# Patient Record
Sex: Female | Born: 1960 | Race: Black or African American | Hispanic: No | Marital: Single
Health system: Southern US, Community
[De-identification: ages and names within clinical notes are randomized; demographics above are authoritative.]

---

## 2003-03-04 ENCOUNTER — Other Ambulatory Visit: Payer: Self-pay

## 2003-04-27 ENCOUNTER — Other Ambulatory Visit: Payer: Self-pay

## 2004-07-25 ENCOUNTER — Emergency Department: Payer: Self-pay | Admitting: Emergency Medicine

## 2004-09-02 ENCOUNTER — Emergency Department: Payer: Self-pay | Admitting: Emergency Medicine

## 2004-11-13 ENCOUNTER — Emergency Department: Payer: Self-pay | Admitting: Emergency Medicine

## 2005-01-25 ENCOUNTER — Ambulatory Visit: Payer: Self-pay | Admitting: Family Medicine

## 2006-05-31 ENCOUNTER — Emergency Department: Payer: Self-pay | Admitting: Emergency Medicine

## 2006-06-18 ENCOUNTER — Emergency Department: Payer: Self-pay | Admitting: Emergency Medicine

## 2006-06-19 ENCOUNTER — Other Ambulatory Visit: Payer: Self-pay

## 2006-10-20 ENCOUNTER — Emergency Department: Payer: Self-pay | Admitting: Emergency Medicine

## 2007-01-01 ENCOUNTER — Emergency Department: Payer: Self-pay | Admitting: Emergency Medicine

## 2007-02-24 ENCOUNTER — Emergency Department: Payer: Self-pay | Admitting: Emergency Medicine

## 2007-02-24 ENCOUNTER — Other Ambulatory Visit: Payer: Self-pay

## 2007-05-03 ENCOUNTER — Emergency Department: Payer: Self-pay | Admitting: Emergency Medicine

## 2007-05-24 ENCOUNTER — Emergency Department: Payer: Self-pay | Admitting: Emergency Medicine

## 2007-07-17 ENCOUNTER — Other Ambulatory Visit: Payer: Self-pay

## 2007-07-17 ENCOUNTER — Emergency Department: Payer: Self-pay | Admitting: Emergency Medicine

## 2007-08-06 ENCOUNTER — Other Ambulatory Visit: Payer: Self-pay

## 2007-08-06 ENCOUNTER — Inpatient Hospital Stay: Payer: Self-pay | Admitting: Internal Medicine

## 2007-08-08 ENCOUNTER — Emergency Department: Payer: Self-pay | Admitting: Emergency Medicine

## 2007-08-16 ENCOUNTER — Emergency Department: Payer: Self-pay | Admitting: Emergency Medicine

## 2007-09-08 ENCOUNTER — Emergency Department: Payer: Self-pay | Admitting: Emergency Medicine

## 2007-09-08 ENCOUNTER — Other Ambulatory Visit: Payer: Self-pay

## 2008-08-11 ENCOUNTER — Emergency Department: Payer: Self-pay | Admitting: Emergency Medicine

## 2008-08-14 ENCOUNTER — Emergency Department: Payer: Self-pay | Admitting: Emergency Medicine

## 2008-09-09 ENCOUNTER — Emergency Department: Payer: Self-pay | Admitting: Emergency Medicine

## 2008-11-27 ENCOUNTER — Emergency Department: Payer: Self-pay | Admitting: Emergency Medicine

## 2009-03-05 ENCOUNTER — Emergency Department: Payer: Self-pay | Admitting: Internal Medicine

## 2009-03-30 ENCOUNTER — Emergency Department: Payer: Self-pay | Admitting: Internal Medicine

## 2009-08-01 ENCOUNTER — Emergency Department: Payer: Self-pay | Admitting: Emergency Medicine

## 2009-12-30 IMAGING — CT CT ABD-PELV W/O CM
1 of 2 series · 15 of 32 positions shown, 19 images · IV contrast (agent unspecified)
Comparison: none

REASON FOR EXAM: (1) abdominal pain; (2) abdominal pain
COMMENTS:   LMP: Four weeks ago

PROCEDURE:     CT  - CT ABDOMEN AND PELVIS W[DATE] [DATE]
RESULT:     Comparison: Abdomen pelvis CT with contrast on 06/19/2006. Pelvic
sonogram on 07/17/2007.
TECHNIQUE: CT examination of the abdomen and pelvis was performed without
contrast. Collimation is 8 mm.

[Series 2: abdomen · axial · 0.86mm/px · z∈[+194,+658]mm · 15 of 64 slices shown, 19 images]
[im 3/64  soft-tissue]
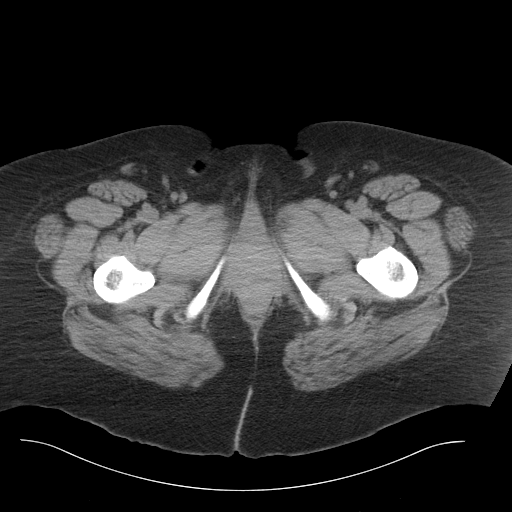
[im 3/64  bone]
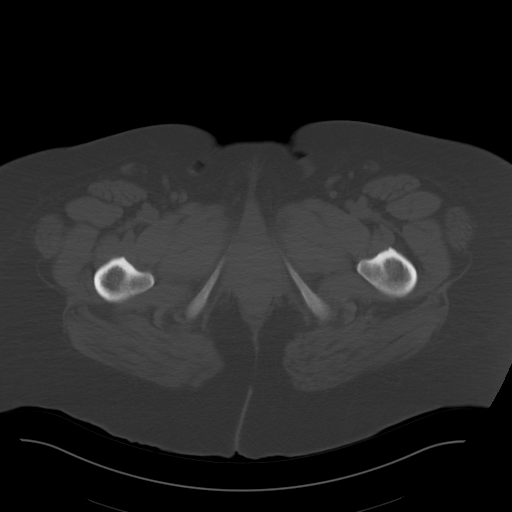
[im 8/64  soft-tissue]
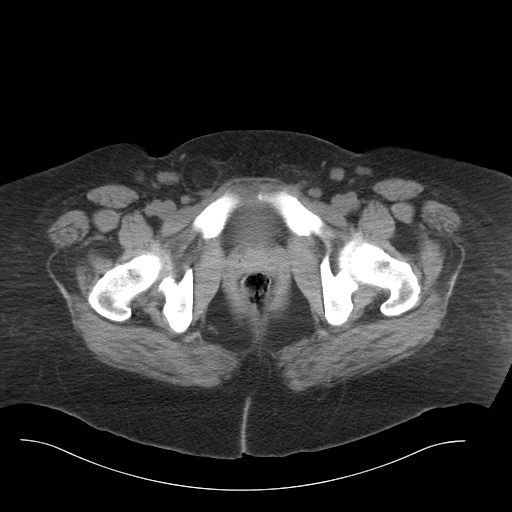
[im 14/64  soft-tissue]
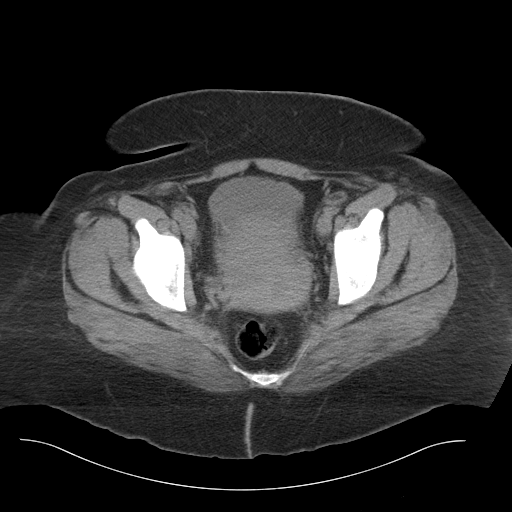
[im 19/64  soft-tissue]
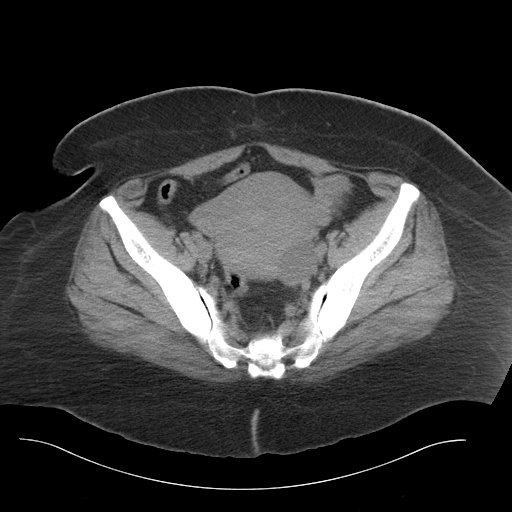
[im 22/64  soft-tissue]
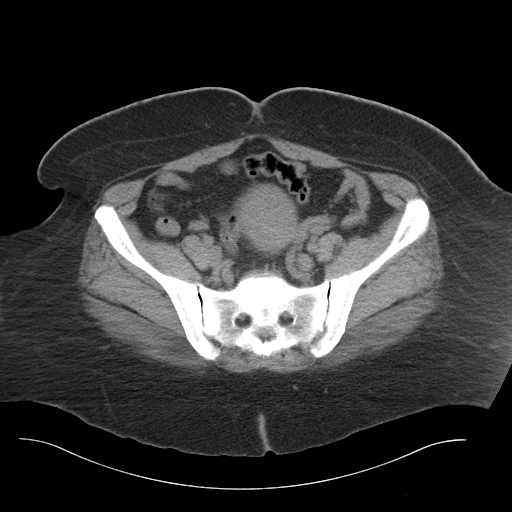
[im 27/64  soft-tissue]
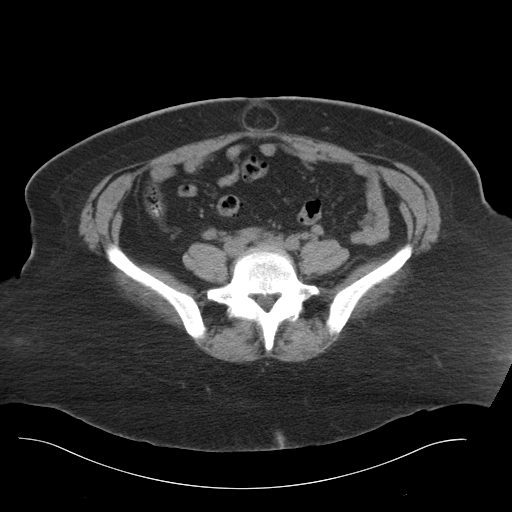
[im 32/64  soft-tissue]
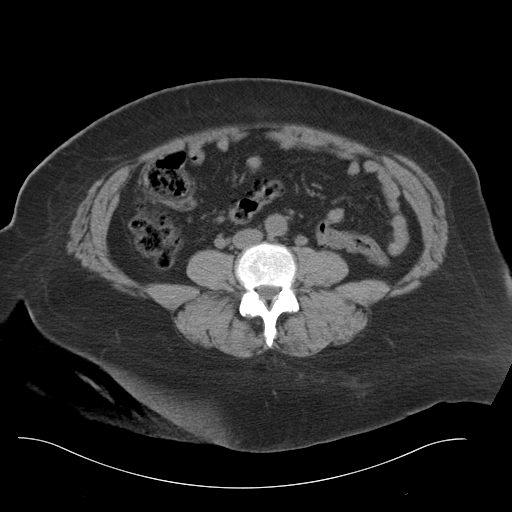
[im 37/64  soft-tissue]
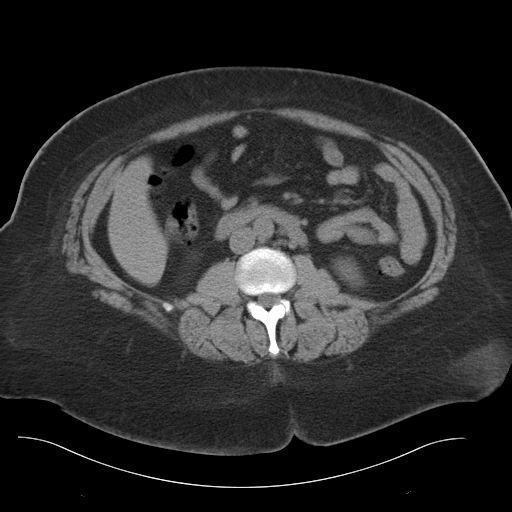
[im 43/64  soft-tissue]
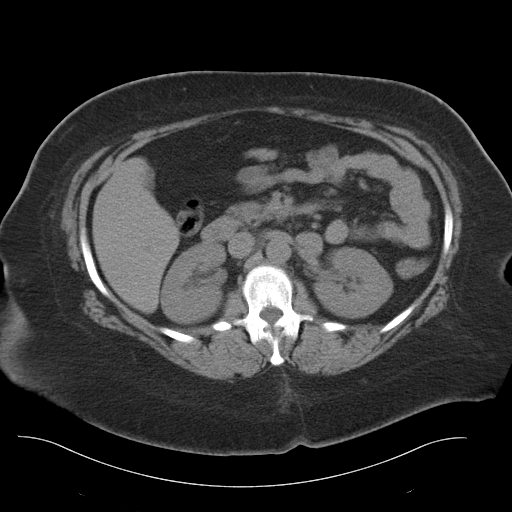
[im 43/64  bone]
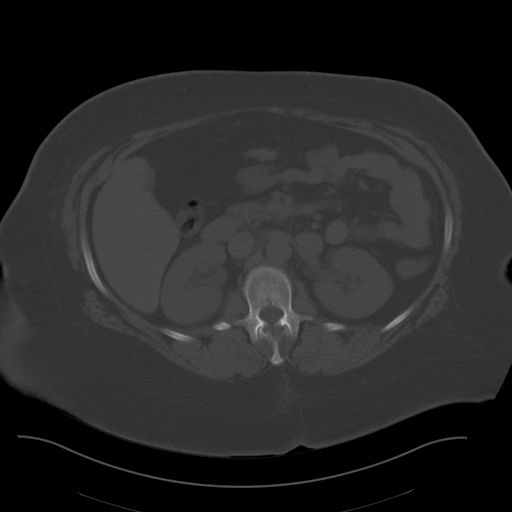
[im 45/64  soft-tissue]
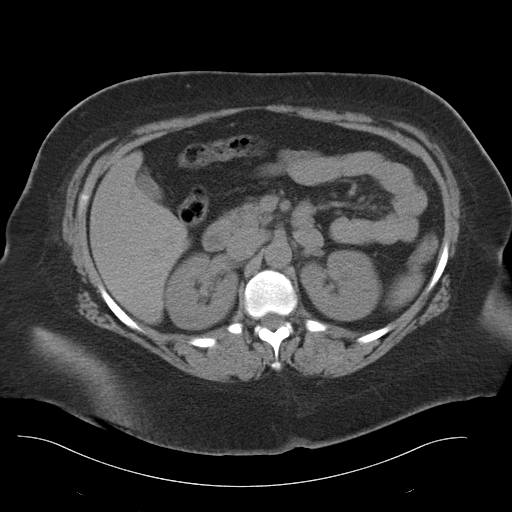
[im 50/64  soft-tissue]
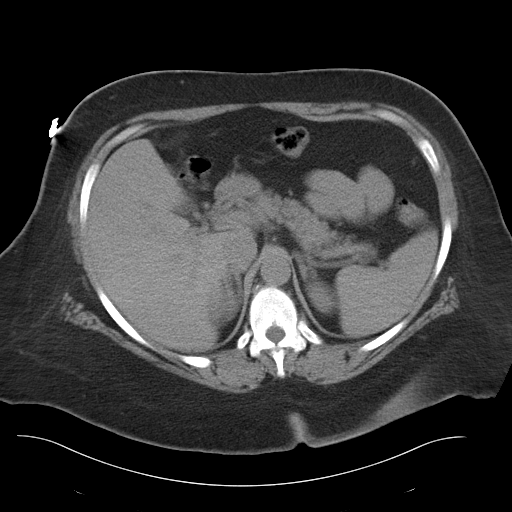
[im 53/64  lung]
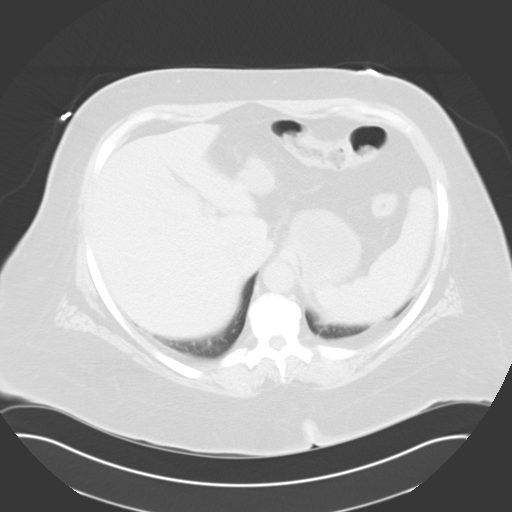
[im 56/64  soft-tissue]
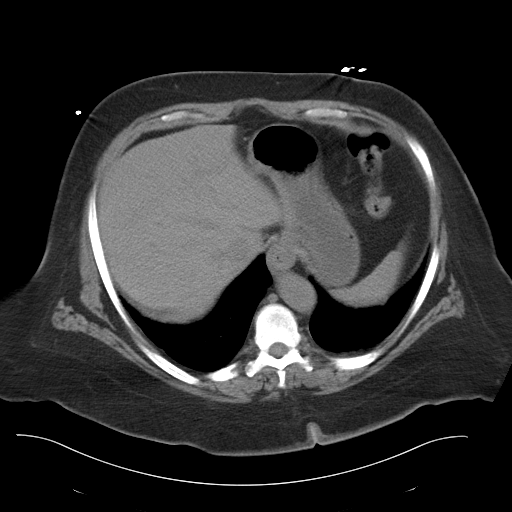
[im 56/64  lung]
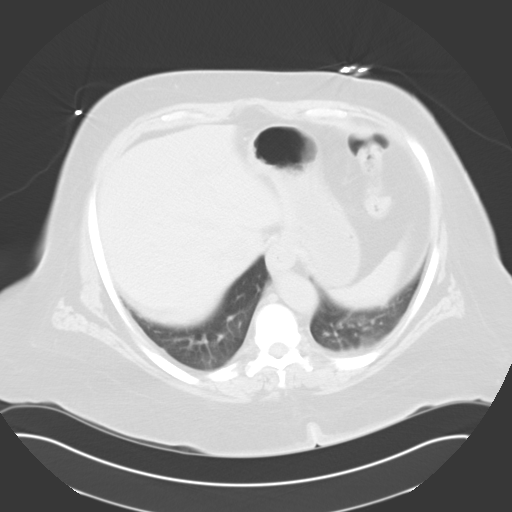
[im 58/64  lung]
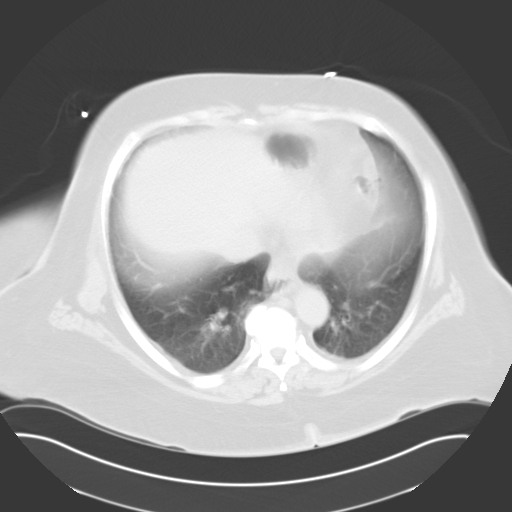
[im 61/64  soft-tissue]
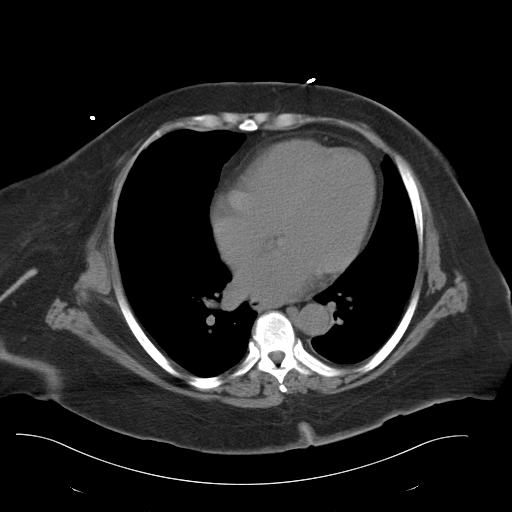
[im 61/64  lung]
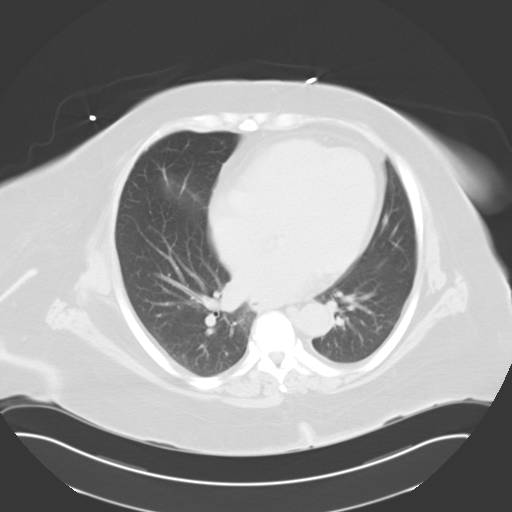

[15 of 32 positions shown; findings below may reference images not displayed]

FINDINGS: Limited evaluation of the lung bases is unremarkable.

Evaluation of the abdominal organs, bowels, and vessels is limited without
contrast. The liver, gallbladder, spleen, pancreas, and adrenal glands are
grossly unremarkable. No renal or ureteral stone is noted. There is no
hydronephrosis.

There is a small fat containing periumbilical hernia. There is no bowel
dilatation. Distal colonic diverticulosis is noted. The appendix is
unremarkable. There is no significant intra abdominal or pelvic fat
stranding. There is no intraperitoneal free air. There is no significant
free fluid. There are no enlarged abdominal pelvic lymph nodes. The uterus
is prominent in size. A 4.4 x 3.0 cm oval structure with high attenuation
material is seen in the left adnexa. This likely represents a hemorrhagic
cyst.
IMPRESSION: 1. Evaluation of the abdominal organs, bowels, and vessels is limited
without contrast.
2. The appendix is unremarkable. There is no bowel obstruction. There is no
significant intra-abdominal or pelvic fat stranding. There is colonic
diverticulosis.
3. The uterus is prominent in size. Uterine fibroids are better seen on
previous pelvic sonogram.
4. A 4.4 x 3.0 cm oval structure with high attenuation material is seen in
the left adnexa. This likely represents a hemorrhagic cyst. This can be
followed with pelvic sonogram in 2 to 3 menstrual cycles.

## 2009-12-31 IMAGING — US US CAROTID DUPLEX BILAT
1 series · 17 of 24 positions shown · non-contrast
Comparison: none

REASON FOR EXAM: syncope
COMMENTS:

[Series 1: us carotid duplex bilat · 17 of 52 slices shown]
[im 1/52]
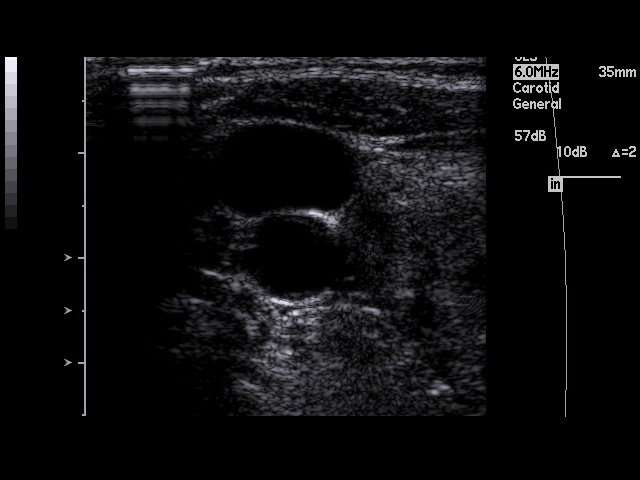
[im 5/52]
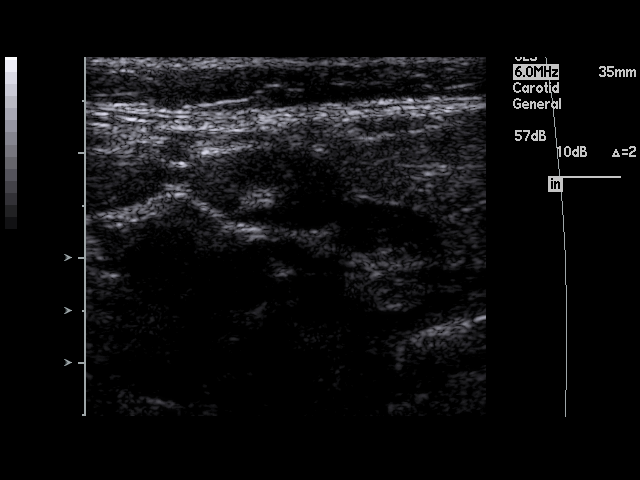
[im 7/52]
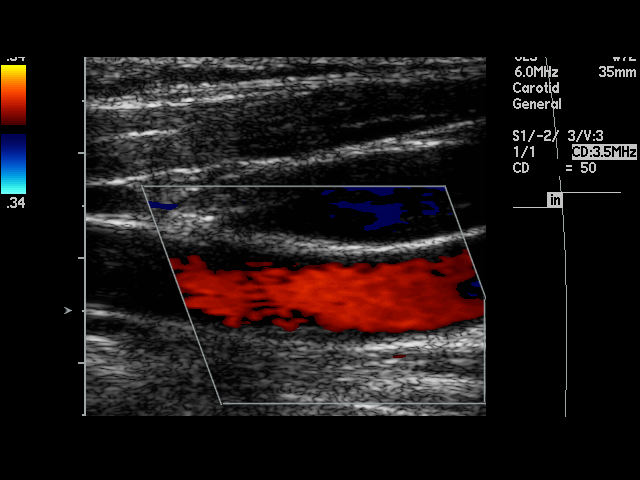
[im 9/52]
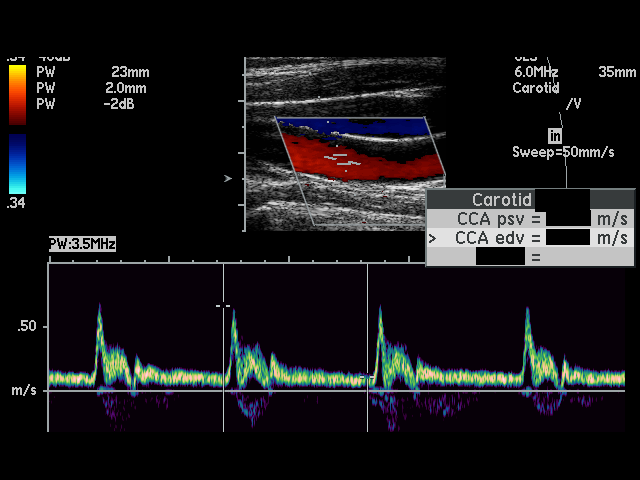
[im 14/52]
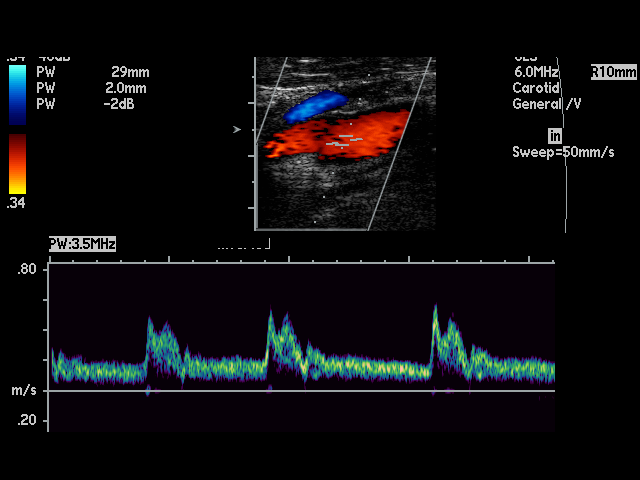
[im 16/52]
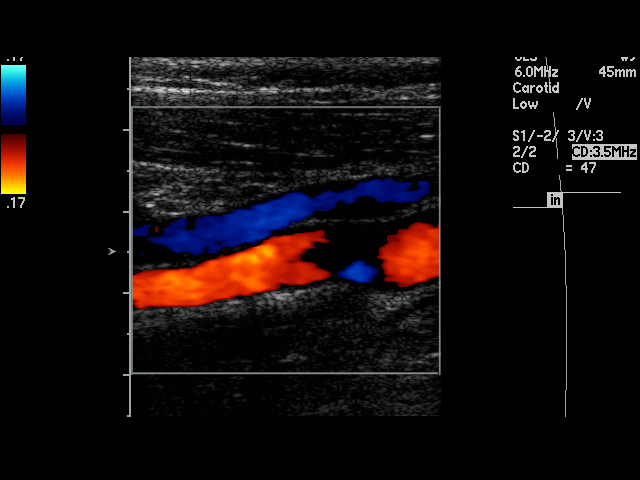
[im 20/52]
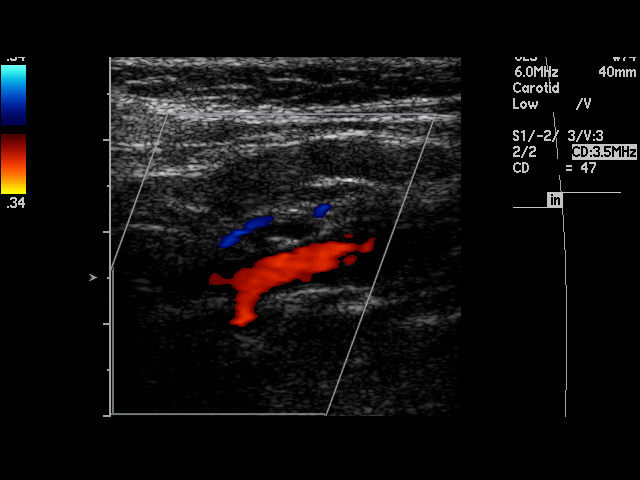
[im 23/52]
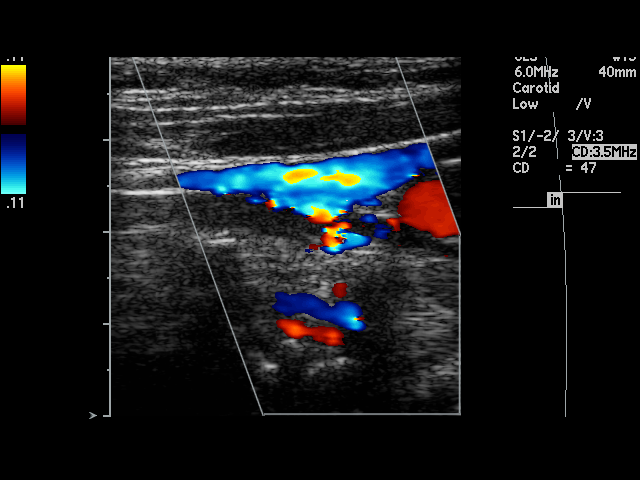
[im 27/52]
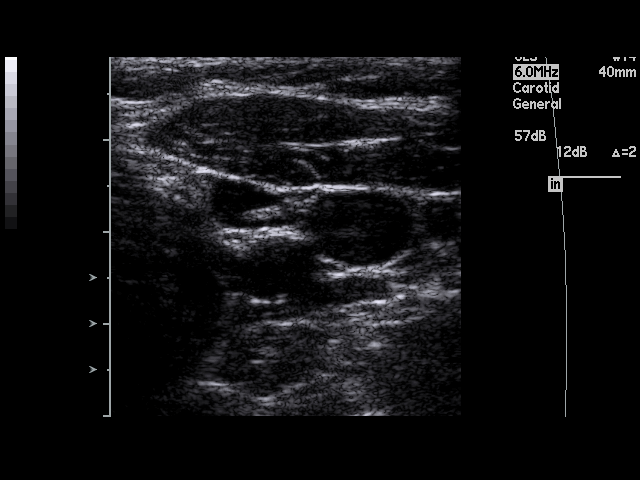
[im 29/52]
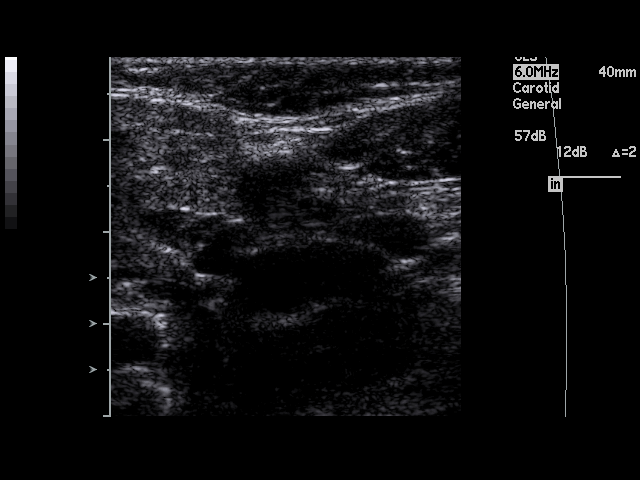
[im 32/52]
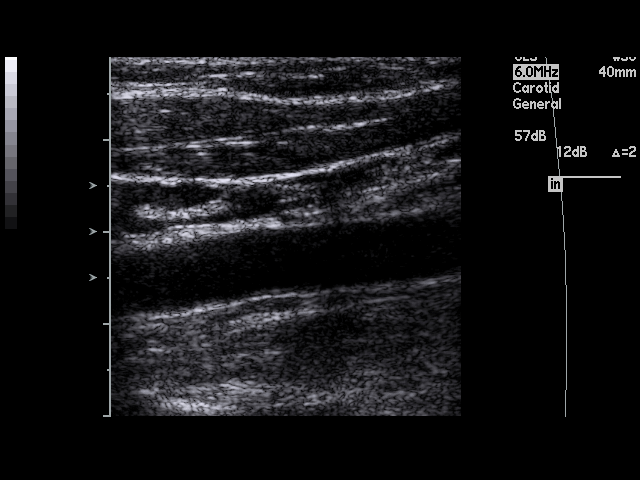
[im 36/52]
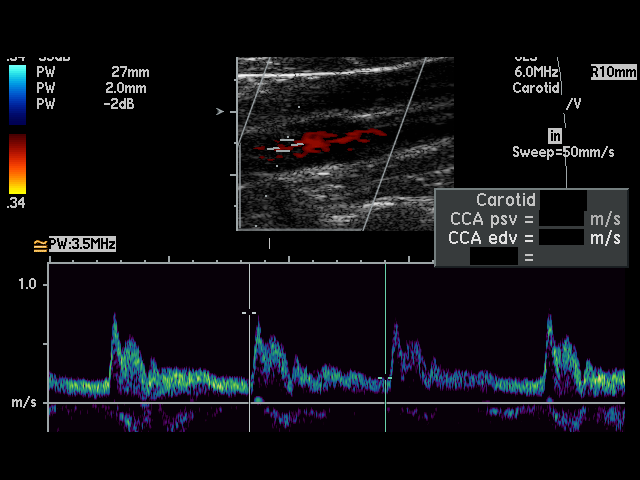
[im 38/52]
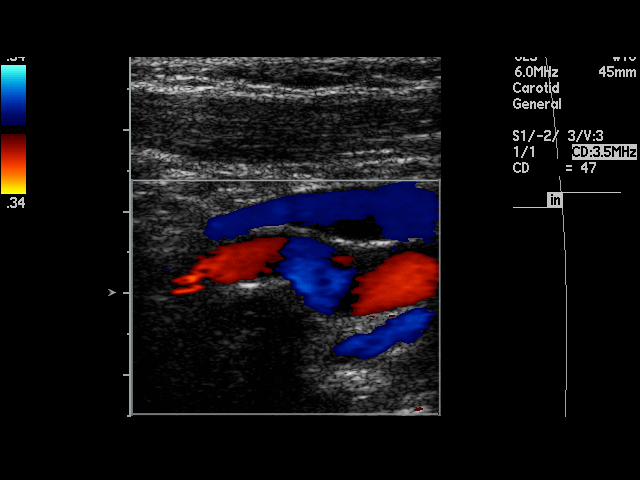
[im 43/52]
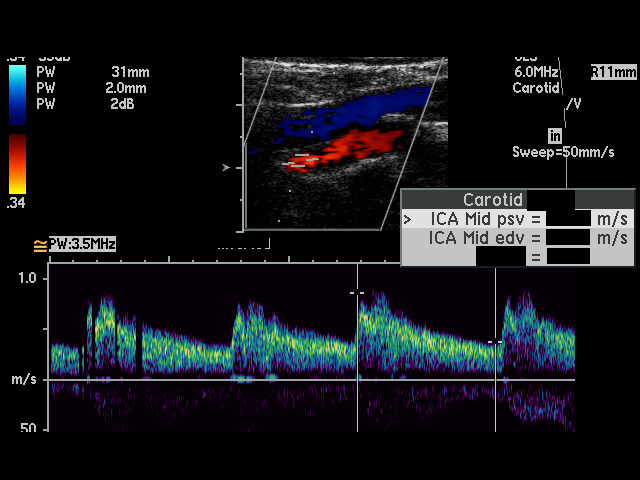
[im 45/52]
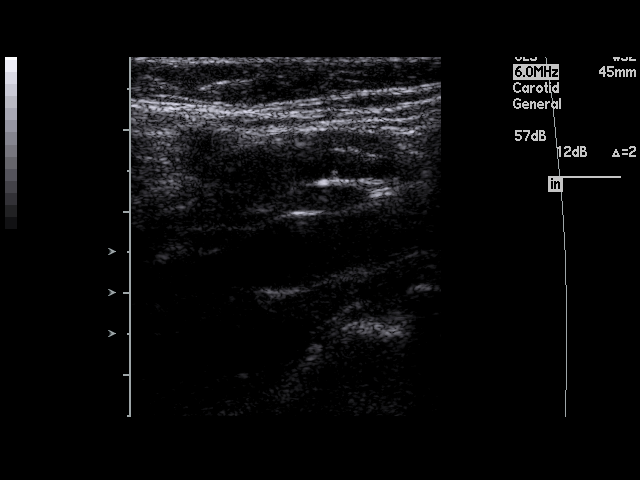
[im 47/52]
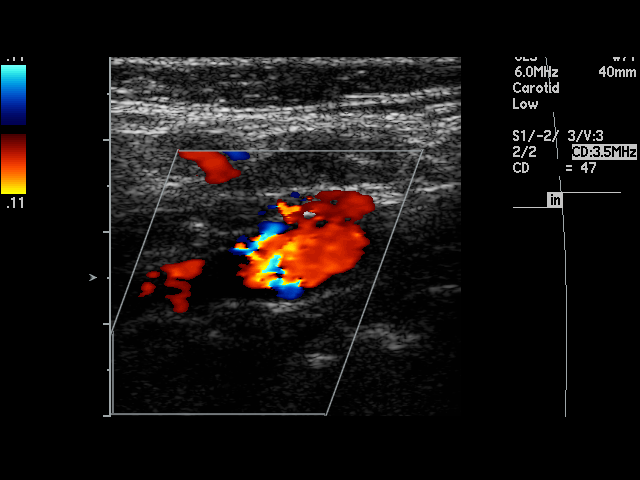
[im 52/52]
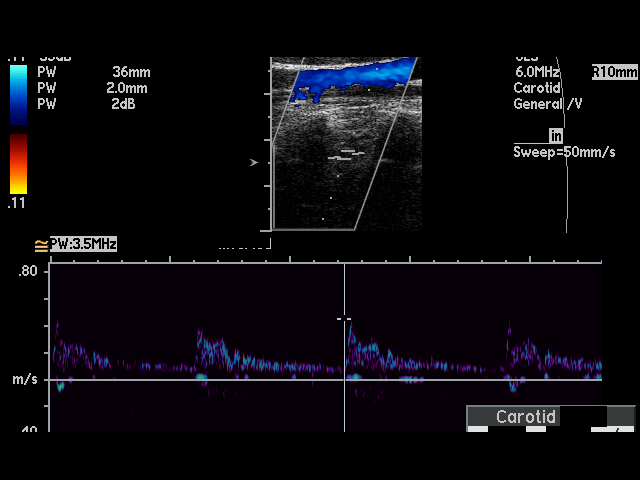

[17 of 24 positions shown; findings below may reference images not displayed]

PROCEDURE:     US  - US CAROTID DOPPLER BILATERAL  - August 07, 2007 [DATE]

RESULT:     No definite plaque formation is seen about the carotid
bifurcations. On the RIGHT, the peak RIGHT common carotid artery flow
velocity measures 0.666 meters per second and the peak RIGHT internal
carotid artery flow velocity measures 0.859 meters per second. The ICA/CCA
ratio measures 1.29.  On the LEFT, the peak LEFT common carotid artery flow
velocity measures 0.786 meters per second and the peak LEFT internal carotid
artery flow velocity measures 0.887 meters per second. The ICA/CCA ratio is
1.128.  These values are in the normal range and are compatible with the
absence of hemodynamically significant stenosis.

There is observed antegrade flow in both vertebrals.
IMPRESSION: 1. No significant abnormalities are identified. No significant plaque
formation or stenosis is seen on either side.
2. There is antegrade flow in both vertebrals.

## 2011-04-04 ENCOUNTER — Emergency Department: Payer: Self-pay | Admitting: Emergency Medicine

## 2011-04-04 LAB — CBC
HCT: 33.6 % — ABNORMAL LOW (ref 35.0–47.0)
HGB: 10.8 g/dL — ABNORMAL LOW (ref 12.0–16.0)
MCH: 25.5 pg — ABNORMAL LOW (ref 26.0–34.0)
WBC: 9.5 10*3/uL (ref 3.6–11.0)

## 2011-04-04 LAB — COMPREHENSIVE METABOLIC PANEL
Albumin: 3.4 g/dL (ref 3.4–5.0)
Alkaline Phosphatase: 83 U/L (ref 50–136)
Bilirubin,Total: 0.3 mg/dL (ref 0.2–1.0)
Co2: 23 mmol/L (ref 21–32)
Creatinine: 0.73 mg/dL (ref 0.60–1.30)
Glucose: 100 mg/dL — ABNORMAL HIGH (ref 65–99)
Osmolality: 284 (ref 275–301)
SGPT (ALT): 13 U/L
Sodium: 142 mmol/L (ref 136–145)
Total Protein: 8.2 g/dL (ref 6.4–8.2)

## 2011-04-04 LAB — URINALYSIS, COMPLETE
Bacteria: NONE SEEN
Glucose,UR: NEGATIVE mg/dL (ref 0–75)
Hyaline Cast: 3
Nitrite: NEGATIVE
RBC,UR: 4 /HPF (ref 0–5)
Squamous Epithelial: 2
WBC UR: 1 /HPF (ref 0–5)

## 2011-04-04 LAB — LIPASE, BLOOD: Lipase: 122 U/L (ref 73–393)

## 2011-04-15 ENCOUNTER — Emergency Department: Payer: Self-pay | Admitting: Emergency Medicine

## 2011-04-15 LAB — COMPREHENSIVE METABOLIC PANEL
Albumin: 3.4 g/dL (ref 3.4–5.0)
Alkaline Phosphatase: 66 U/L (ref 50–136)
Anion Gap: 9 (ref 7–16)
BUN: 17 mg/dL (ref 7–18)
Bilirubin,Total: 0.5 mg/dL (ref 0.2–1.0)
Co2: 31 mmol/L (ref 21–32)
Creatinine: 0.86 mg/dL (ref 0.60–1.30)
EGFR (Non-African Amer.): >60
Osmolality: 290 (ref 275–301)
Potassium: 3.4 mmol/L — ABNORMAL LOW (ref 3.5–5.1)
SGPT (ALT): 11 U/L — ABNORMAL LOW
Sodium: 145 mmol/L (ref 136–145)
Total Protein: 8.7 g/dL — ABNORMAL HIGH (ref 6.4–8.2)

## 2011-04-15 LAB — CBC
HCT: 33.5 % — ABNORMAL LOW (ref 35.0–47.0)
HGB: 11.1 g/dL — ABNORMAL LOW (ref 12.0–16.0)
MCHC: 33 g/dL (ref 32.0–36.0)
MCV: 79 fL — ABNORMAL LOW (ref 80–100)
Platelet: 265 10*3/uL (ref 150–440)
RDW: 15.9 % — ABNORMAL HIGH (ref 11.5–14.5)
WBC: 11.6 10*3/uL — ABNORMAL HIGH (ref 3.6–11.0)

## 2011-04-15 LAB — CK TOTAL AND CKMB (NOT AT ARMC): CK, Total: 47 U/L (ref 21–215)

## 2011-07-27 ENCOUNTER — Inpatient Hospital Stay: Payer: Self-pay | Admitting: Specialist

## 2011-07-27 LAB — COMPREHENSIVE METABOLIC PANEL
Albumin: 2.8 g/dL — ABNORMAL LOW (ref 3.4–5.0)
Alkaline Phosphatase: 78 U/L (ref 50–136)
Anion Gap: 7 (ref 7–16)
Bilirubin,Total: 0.7 mg/dL (ref 0.2–1.0)
Creatinine: 1.48 mg/dL — ABNORMAL HIGH (ref 0.60–1.30)
EGFR (African American): 47 — ABNORMAL LOW
EGFR (Non-African Amer.): 40 — ABNORMAL LOW
Osmolality: 279 (ref 275–301)
SGOT(AST): 47 U/L — ABNORMAL HIGH (ref 15–37)
SGPT (ALT): 30 U/L
Sodium: 137 mmol/L (ref 136–145)

## 2011-07-27 LAB — CBC
HCT: 33.1 % — ABNORMAL LOW (ref 35.0–47.0)
HGB: 10.6 g/dL — ABNORMAL LOW (ref 12.0–16.0)
MCH: 25.4 pg — ABNORMAL LOW (ref 26.0–34.0)
MCHC: 32.1 g/dL (ref 32.0–36.0)

## 2011-07-27 LAB — URINALYSIS, COMPLETE
Bilirubin,UR: NEGATIVE
Glucose,UR: NEGATIVE mg/dL (ref 0–75)
Granular Cast: 1
Leukocyte Esterase: NEGATIVE
Ph: 5 (ref 4.5–8.0)
Protein: 100
WBC UR: 4 /HPF (ref 0–5)

## 2011-07-27 LAB — PRO B NATRIURETIC PEPTIDE: B-Type Natriuretic Peptide: 88 pg/mL (ref 0–125)

## 2011-07-27 LAB — CK TOTAL AND CKMB (NOT AT ARMC): CK, Total: 367 U/L — ABNORMAL HIGH (ref 21–215)

## 2011-07-27 LAB — RAPID INFLUENZA A&B ANTIGENS

## 2011-07-27 LAB — TROPONIN I: Troponin-I: <0.02 ng/mL

## 2011-07-28 LAB — BASIC METABOLIC PANEL
BUN: 22 mg/dL — ABNORMAL HIGH (ref 7–18)
Calcium, Total: 8.5 mg/dL (ref 8.5–10.1)
Creatinine: 1.14 mg/dL (ref 0.60–1.30)
EGFR (African American): >60
EGFR (Non-African Amer.): 55 — ABNORMAL LOW
Glucose: 103 mg/dL — ABNORMAL HIGH (ref 65–99)
Osmolality: 277 (ref 275–301)
Potassium: 3.2 mmol/L — ABNORMAL LOW (ref 3.5–5.1)
Sodium: 137 mmol/L (ref 136–145)

## 2011-07-28 LAB — CBC WITH DIFFERENTIAL/PLATELET
Basophil #: 0 10*3/uL (ref 0.0–0.1)
Basophil %: 0.2 %
Eosinophil #: 0.1 10*3/uL (ref 0.0–0.7)
HCT: 30.6 % — ABNORMAL LOW (ref 35.0–47.0)
HGB: 9.7 g/dL — ABNORMAL LOW (ref 12.0–16.0)
Lymphocyte #: 1.8 10*3/uL (ref 1.0–3.6)
Lymphocyte %: 11.2 %
MCH: 25.2 pg — ABNORMAL LOW (ref 26.0–34.0)
MCHC: 31.8 g/dL — ABNORMAL LOW (ref 32.0–36.0)
MCV: 79 fL — ABNORMAL LOW (ref 80–100)
Monocyte #: 1.5 x10 3/mm — ABNORMAL HIGH (ref 0.2–0.9)
Monocyte %: 9.3 %
Neutrophil #: 12.9 10*3/uL — ABNORMAL HIGH (ref 1.4–6.5)
Neutrophil %: 78.9 %
Platelet: 170 10*3/uL (ref 150–440)
RDW: 15.6 % — ABNORMAL HIGH (ref 11.5–14.5)
WBC: 16.3 10*3/uL — ABNORMAL HIGH (ref 3.6–11.0)

## 2011-07-28 LAB — FERRITIN: Ferritin (ARMC): 308 ng/mL (ref 8–388)

## 2011-07-28 LAB — IRON AND TIBC
Iron Bind.Cap.(Total): 263 ug/dL (ref 250–450)
Iron: 50 ug/dL (ref 50–170)

## 2011-07-29 LAB — CBC WITH DIFFERENTIAL/PLATELET
Basophil %: 0.4 %
Eosinophil #: 0.2 10*3/uL (ref 0.0–0.7)
Eosinophil %: 2.3 %
Lymphocyte %: 15.1 %
MCHC: 32 g/dL (ref 32.0–36.0)
Monocyte #: 0.9 x10 3/mm (ref 0.2–0.9)
Neutrophil #: 6.9 10*3/uL — ABNORMAL HIGH (ref 1.4–6.5)
Neutrophil %: 73 %
Platelet: 213 10*3/uL (ref 150–440)
RDW: 15.4 % — ABNORMAL HIGH (ref 11.5–14.5)
WBC: 9.4 10*3/uL (ref 3.6–11.0)

## 2011-07-30 LAB — EXPECTORATED SPUTUM ASSESSMENT W GRAM STAIN, RFLX TO RESP C

## 2011-08-01 LAB — CULTURE, BLOOD (SINGLE)

## 2012-05-18 ENCOUNTER — Ambulatory Visit: Payer: Self-pay | Admitting: Radiology

## 2012-07-23 ENCOUNTER — Ambulatory Visit: Payer: Self-pay

## 2012-08-16 ENCOUNTER — Ambulatory Visit: Payer: Self-pay

## 2012-09-06 ENCOUNTER — Emergency Department: Payer: Self-pay | Admitting: Internal Medicine

## 2012-10-04 ENCOUNTER — Emergency Department: Payer: Self-pay | Admitting: Emergency Medicine

## 2012-10-04 LAB — CBC
HCT: 32.8 % — ABNORMAL LOW (ref 35.0–47.0)
HGB: 10.7 g/dL — ABNORMAL LOW (ref 12.0–16.0)
MCH: 25.9 pg — ABNORMAL LOW (ref 26.0–34.0)
MCHC: 32.8 g/dL (ref 32.0–36.0)
Platelet: 217 10*3/uL (ref 150–440)

## 2012-10-04 LAB — URINALYSIS, COMPLETE
Glucose,UR: NEGATIVE mg/dL (ref 0–75)
Hyaline Cast: 1
Protein: NEGATIVE
RBC,UR: 5 /HPF (ref 0–5)
Specific Gravity: 1.025 (ref 1.003–1.030)

## 2012-10-04 LAB — COMPREHENSIVE METABOLIC PANEL
Alkaline Phosphatase: 92 U/L (ref 50–136)
Anion Gap: 9 (ref 7–16)
Bilirubin,Total: 0.4 mg/dL (ref 0.2–1.0)
Chloride: 102 mmol/L (ref 98–107)
Co2: 29 mmol/L (ref 21–32)
EGFR (Non-African Amer.): 54 — ABNORMAL LOW
Glucose: 122 mg/dL — ABNORMAL HIGH (ref 65–99)
Osmolality: 282 (ref 275–301)
Potassium: 3.3 mmol/L — ABNORMAL LOW (ref 3.5–5.1)
SGOT(AST): 9 U/L — ABNORMAL LOW (ref 15–37)
SGPT (ALT): 11 U/L — ABNORMAL LOW (ref 12–78)
Sodium: 140 mmol/L (ref 136–145)

## 2012-10-04 LAB — LIPASE, BLOOD: Lipase: 124 U/L (ref 73–393)

## 2012-10-05 ENCOUNTER — Emergency Department: Payer: Self-pay | Admitting: Emergency Medicine

## 2012-10-05 LAB — CBC
HCT: 33.4 % — ABNORMAL LOW (ref 35.0–47.0)
HGB: 10.9 g/dL — ABNORMAL LOW (ref 12.0–16.0)
MCH: 25.5 pg — ABNORMAL LOW (ref 26.0–34.0)
Platelet: 231 10*3/uL (ref 150–440)
RDW: 16.8 % — ABNORMAL HIGH (ref 11.5–14.5)
WBC: 7.9 10*3/uL (ref 3.6–11.0)

## 2012-10-05 LAB — COMPREHENSIVE METABOLIC PANEL
Albumin: 3.4 g/dL (ref 3.4–5.0)
Anion Gap: 6 — ABNORMAL LOW (ref 7–16)
Bilirubin,Total: 0.7 mg/dL (ref 0.2–1.0)
Chloride: 100 mmol/L (ref 98–107)
EGFR (African American): >60
EGFR (Non-African Amer.): >60
Glucose: 105 mg/dL — ABNORMAL HIGH (ref 65–99)
Potassium: 3.3 mmol/L — ABNORMAL LOW (ref 3.5–5.1)
SGOT(AST): 10 U/L — ABNORMAL LOW (ref 15–37)
Sodium: 139 mmol/L (ref 136–145)
Total Protein: 8.5 g/dL — ABNORMAL HIGH (ref 6.4–8.2)

## 2013-03-28 DEATH — deceased
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
## 2014-07-20 NOTE — Discharge Summary (Signed)
PATIENT NAME:  Judy Perry, Judy Perry MR#:  [id number] DATE OF BIRTH:  [birth year]  DATE OF ADMISSION:  07/27/2011 DATE OF DISCHARGE:  07/29/2011  For a detailed note, please see the History and Physical on admission by Dr. Delfino LovettVipul Shah.   DIAGNOSES AT DISCHARGE:  1. Community-acquired pneumonia secondary to strep pneumo, much improved.  2. Leukocytosis secondary to pneumonia, now resolved.  3. Hypertension.  4. Obesity.  5. Hypokalemia.   DIET: The patient is being discharged on a low sodium diet.   ACTIVITY: As tolerated.   FOLLOWUP: Followup is at the [facility] on 08/09/2011.    DISCHARGE MEDICATIONS:  1. Hydrochlorothiazide 25 mg daily.  2. Levaquin 750 mg p.o. daily times 7 days.   PERTINENT STUDIES DURING HOSPITAL COURSE: Chest x-ray done on admission showed thickening of the right perihilar markings suspicious for pneumonia. Sputum culture was also positive for strep pneumo.   HOSPITAL COURSE: This is a 54 year old female with medical problems as mentioned above who presented to the hospital on 07/27/2011 due to shortness of breath and cough ongoing for a few days.  1. Pneumonia:  This was likely the cause of the patient's shortness of breath and cough. She had positive chest x-ray findings for pneumonia. She was empirically started on IV ceftriaxone and Zithromax. She has clinically significantly improved with IV antibiotics. She is currently being discharged on p.o. Levaquin. As mentioned a sputum culture was positive for strep pneumo and the Levaquin should cover that.  2. Leukocytosis: When the patient presented to the hospital her white cell count was 20,000. It has come down to 9.4 on the day of discharge.  3. Hypokalemia: This has since then been supplemented and now improved and resolved.  4. Anemia: The patient apparently has anemia of chronic disease. I did do iron studies. Her iron level and ferritin level were normal. Her TIBC was low normal. This was likely consistent  with anemia of chronic disease, and this further needs to be followed up by the patient's primary care physician.   5. Hypertension: The patient prior to coming to the hospital was not taking any antihypertensives, although she had been on hydrochlorothiazide before. Her pressure somewhat stayed normotensive to low normal side. I did give her a prescription for hydrochlorothiazide and her blood pressure can be further followed by her primary care physician.   CODE STATUS: The patient is a FULL CODE.   TIME SPENT:  40 minutes.  ____________________________ Rolly PancakeVivek J. Cherlynn KaiserSainani, MD vjs:bjt D: 07/29/2011 15:56:33 ET T: 07/29/2011 17:02:19 ET JOB#: [id number]  cc: Rolly PancakeVivek J. Cherlynn KaiserSainani, MD, <Dictator> [facility] [place] SirenVIVEK J SAINANI MD ELECTRONICALLY SIGNED 08/05/2011 18:48

## 2015-01-31 IMAGING — CR DG CHEST 2V
1 series · 2 of 2 positions shown · non-contrast
Comparison: none

REASON FOR EXAM: cough
COMMENTS:

PROCEDURE:     DXR - DXR CHEST PA (OR AP) AND LATERAL  - September 06, 2012 [DATE]
RESULT:     Comparison: 05/18/2012

[Series 1: pa · 0.17mm/px · 2 of 2 slices shown]
[im 1/2]
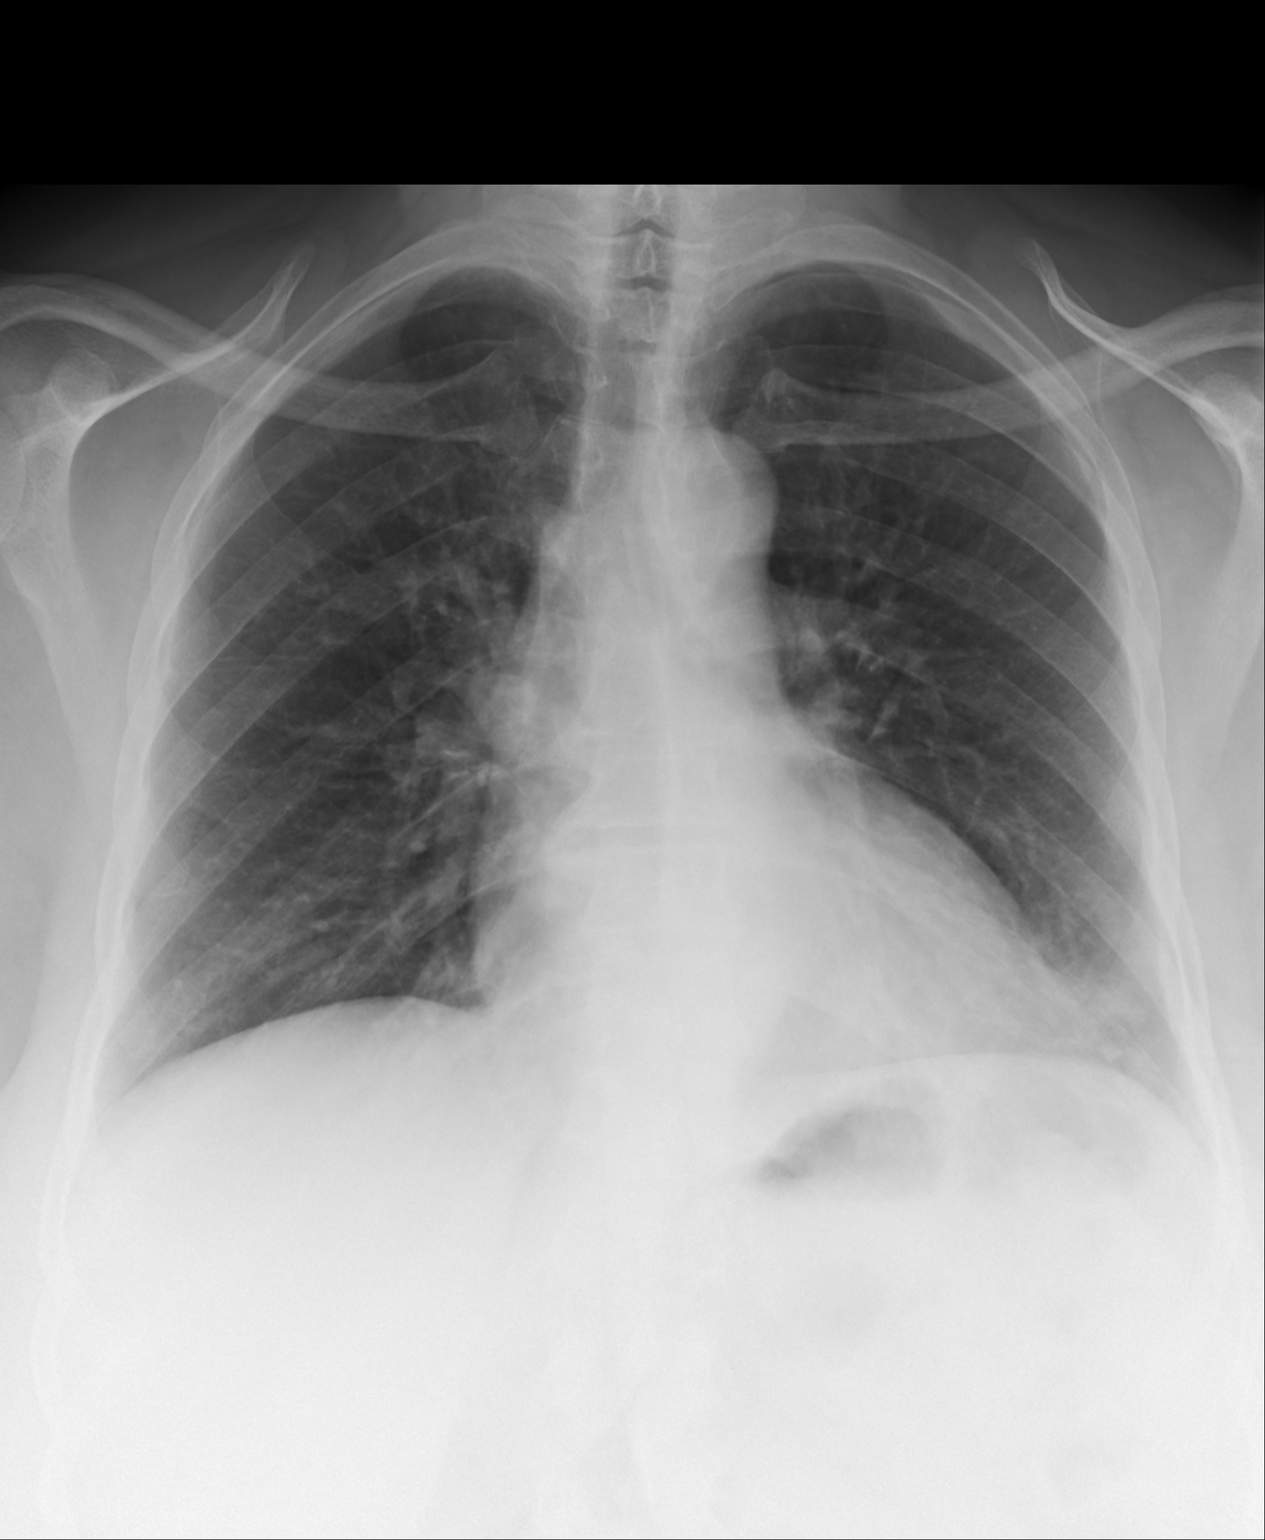
[im 2/2]
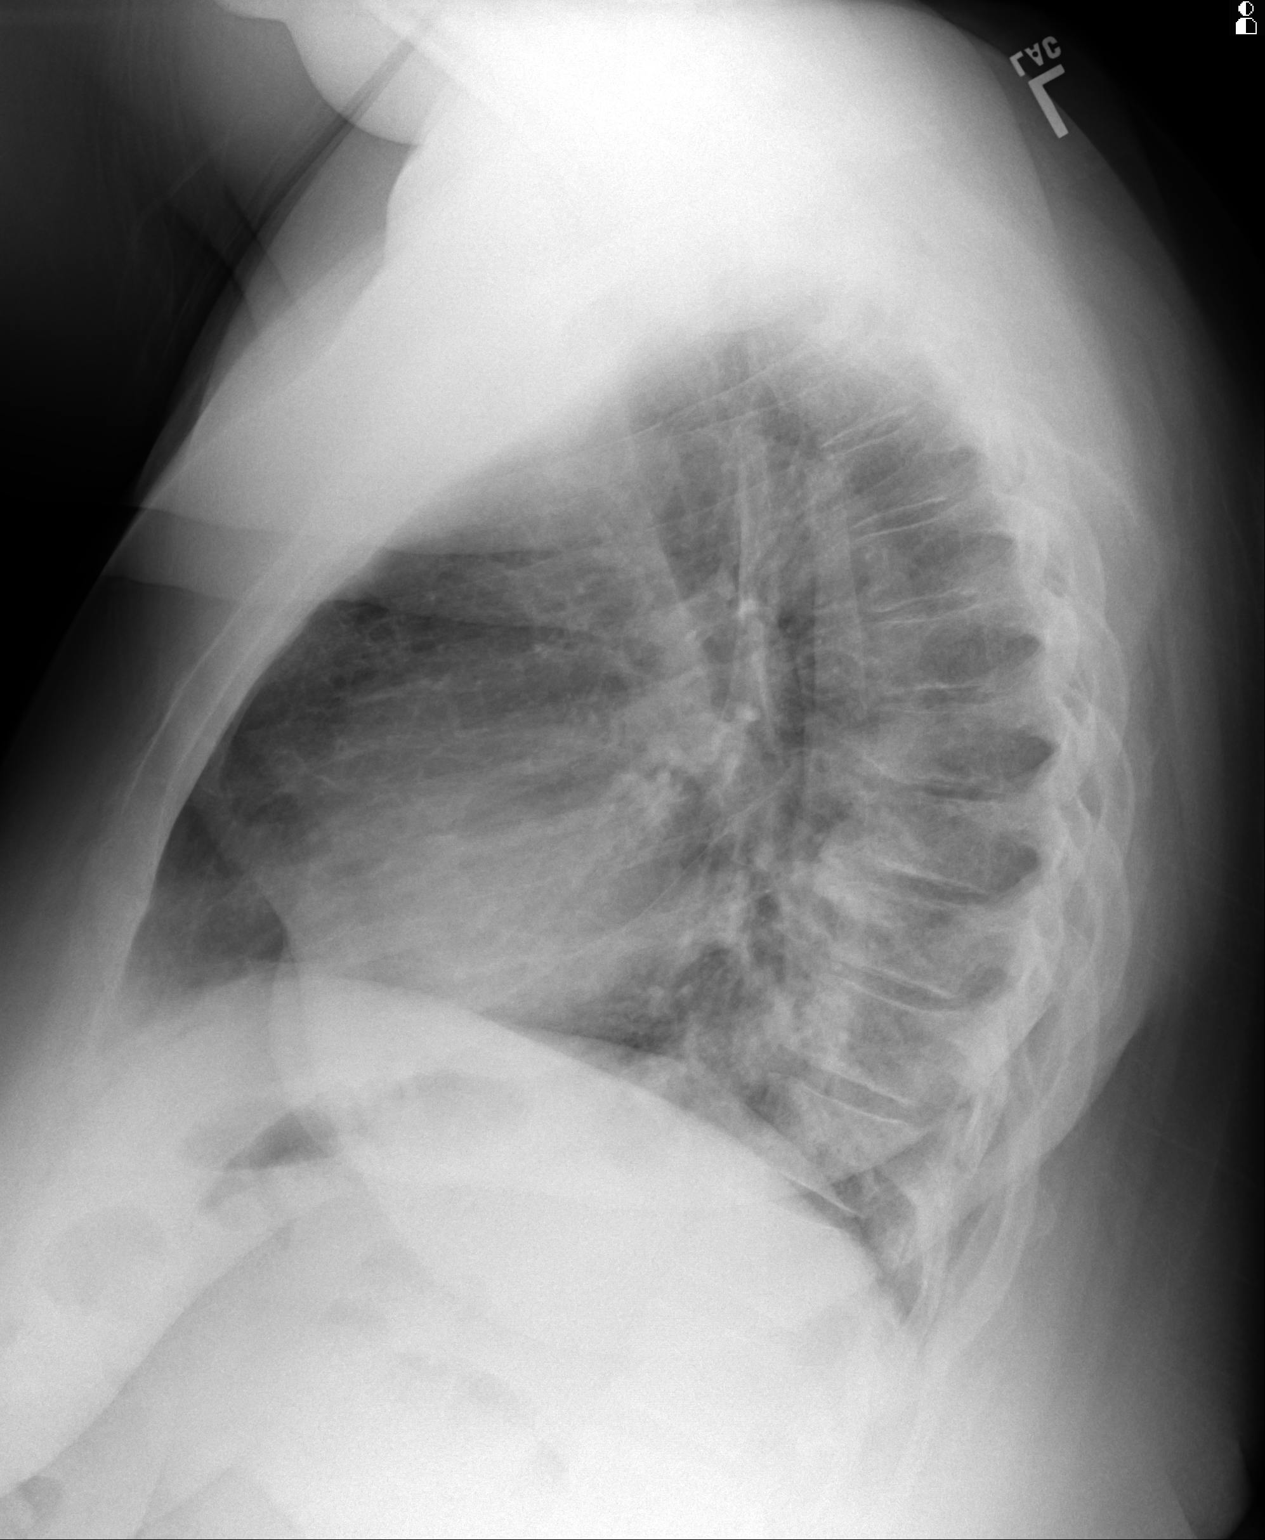

[2 of 2 positions shown; findings below may reference images not displayed]

FINDINGS: The heart and mediastinum are stable. There are mild heterogeneous opacities
in the left lower lobe.
IMPRESSION: Findings concerning for left lower lobe pneumonia. Followup PA and lateral
chest radiograph is recommended to ensure resolution.

[REDACTED]
